# Patient Record
Sex: Male | Born: 1970 | Race: White | Hispanic: No | Marital: Single | Smoking: Never smoker
Health system: Southern US, Community
[De-identification: ages and names within clinical notes are randomized; demographics above are authoritative.]

## PROBLEM LIST (undated history)

## (undated) DIAGNOSIS — N200 Calculus of kidney: Secondary | ICD-10-CM

## (undated) HISTORY — PX: SHOULDER ARTHROSCOPY WITH SUBACROMIAL DECOMPRESSION: SHX5684

## (undated) HISTORY — PX: LITHOTRIPSY: SUR834

## (undated) HISTORY — PX: APPENDECTOMY: SHX54

## (undated) HISTORY — PX: ANKLE RECONSTRUCTION: SHX1151

---

## 2014-06-12 HISTORY — PX: URETERAL STENT PLACEMENT: SHX822

## 2015-02-26 ENCOUNTER — Emergency Department (HOSPITAL_COMMUNITY): Payer: BLUE CROSS/BLUE SHIELD

## 2015-02-26 ENCOUNTER — Encounter (HOSPITAL_COMMUNITY): Payer: Self-pay

## 2015-02-26 ENCOUNTER — Emergency Department (HOSPITAL_COMMUNITY)
Admission: EM | Admit: 2015-02-26 | Discharge: 2015-02-27 | Disposition: A | Discharge status: Home / Self Care | Payer: BLUE CROSS/BLUE SHIELD | Attending: Emergency Medicine | Admitting: Emergency Medicine

## 2015-02-26 DIAGNOSIS — R109 Unspecified abdominal pain: Secondary | ICD-10-CM | POA: Diagnosis present

## 2015-02-26 DIAGNOSIS — N200 Calculus of kidney: Secondary | ICD-10-CM

## 2015-02-26 HISTORY — DX: Calculus of kidney: N20.0

## 2015-02-26 LAB — CBC WITH DIFFERENTIAL/PLATELET
Basophils Absolute: 0 10*3/uL (ref 0.0–0.1)
Basophils Relative: 0 %
EOS PCT: 3 %
Eosinophils Absolute: 0.2 10*3/uL (ref 0.0–0.7)
HEMATOCRIT: 44.4 % (ref 39.0–52.0)
Hemoglobin: 16 g/dL (ref 13.0–17.0)
LYMPHS ABS: 1.7 10*3/uL (ref 0.7–4.0)
LYMPHS PCT: 23 %
MCH: 33.4 pg (ref 26.0–34.0)
MCHC: 36 g/dL (ref 30.0–36.0)
MCV: 92.7 fL (ref 78.0–100.0)
MONO ABS: 1.1 10*3/uL — AB (ref 0.1–1.0)
Monocytes Relative: 15 %
NEUTROS ABS: 4.3 10*3/uL (ref 1.7–7.7)
Neutrophils Relative %: 59 %
PLATELETS: 318 10*3/uL (ref 150–400)
RBC: 4.79 MIL/uL (ref 4.22–5.81)
RDW: 11.7 % (ref 11.5–15.5)
WBC: 7.2 10*3/uL (ref 4.0–10.5)

## 2015-02-26 LAB — URINALYSIS, ROUTINE W REFLEX MICROSCOPIC
Bilirubin Urine: NEGATIVE
GLUCOSE, UA: NEGATIVE mg/dL
KETONES UR: NEGATIVE mg/dL
Nitrite: NEGATIVE
PROTEIN: 100 mg/dL — AB
Specific Gravity, Urine: 1.02 (ref 1.005–1.030)
UROBILINOGEN UA: 0.2 mg/dL (ref 0.0–1.0)
pH: 5.5 (ref 5.0–8.0)

## 2015-02-26 LAB — COMPREHENSIVE METABOLIC PANEL
ALT: 26 U/L (ref 17–63)
AST: 32 U/L (ref 15–41)
Albumin: 3.9 g/dL (ref 3.5–5.0)
Alkaline Phosphatase: 79 U/L (ref 38–126)
Anion gap: 10 (ref 5–15)
BILIRUBIN TOTAL: 0.7 mg/dL (ref 0.3–1.2)
BUN: 20 mg/dL (ref 6–20)
CHLORIDE: 103 mmol/L (ref 101–111)
CO2: 24 mmol/L (ref 22–32)
CREATININE: 1.34 mg/dL — AB (ref 0.61–1.24)
Calcium: 9 mg/dL (ref 8.9–10.3)
Glucose, Bld: 97 mg/dL (ref 65–99)
POTASSIUM: 3.8 mmol/L (ref 3.5–5.1)
Sodium: 137 mmol/L (ref 135–145)
TOTAL PROTEIN: 7.1 g/dL (ref 6.5–8.1)

## 2015-02-26 LAB — URINE MICROSCOPIC-ADD ON

## 2015-02-26 MED ORDER — HYDROMORPHONE HCL 1 MG/ML IJ SOLN
1.0000 mg | Freq: Once | INTRAMUSCULAR | Status: AC
  Administered 2015-02-26: 1 mg via INTRAVENOUS
  Filled 2015-02-26: qty 1

## 2015-02-26 MED ORDER — OXYCODONE-ACETAMINOPHEN 5-325 MG PO TABS
1.0000 | ORAL_TABLET | Freq: Once | ORAL | Status: AC
  Administered 2015-02-26: 1 via ORAL
  Filled 2015-02-26: qty 1

## 2015-02-26 NOTE — ED Notes (Signed)
Patient transported to Ultrasound 

## 2015-02-26 NOTE — ED Notes (Signed)
The pt last voided  Well at 1600  He has just been dribbling since then

## 2015-02-26 NOTE — ED Notes (Addendum)
The pt is c/o lt  Flank pain and on Monday in [place] he had laser removal 3 kidney stones.  His pain went away after that   For 2 days the returned with bloody urine and not able to void .Marland Kitchen

## 2015-02-26 NOTE — ED Notes (Signed)
Last percocet was at 1630  He took 2 tabs  And he had toradol around 1330

## 2015-02-26 NOTE — ED Provider Notes (Signed)
CSN: [id number]     Arrival date & time 02/26/15  2023 History   First MD Initiated Contact with Patient 02/26/15 2146     Chief Complaint  Patient presents with  . Flank Pain     (Consider location/radiation/quality/duration/timing/severity/associated sxs/prior Treatment) HPI   Caleb Lane is a 44 y.o. male with PMH significant for kidney stones who presents with left flank pain.  Pt recently moved from [place] and is establishing care in the [place] area.  Pt states on Monday he had a lithotripsy along with stent placement to remove 3 stones.  Tuesday AM he went to the ER in [place] for left flank pain and he was unable to void.  He was discharged with pain management and stool softner once he was able to void.  He began doing better Wednesday and Thursday.  However, this morning he began experiencing sharp intermittent left flank pain with hematuria.  He reports he has been unable to void since 3 pm.  He has been taking his Flomax, Norco, and Toradol.  He has an appointment with [facility] for September 20th.  He is still passing stone particles and clots.  Denies fever, CP, SOB, abdominal pain, or back pain.   Past Medical History  Diagnosis Date  . Kidney calculi    Past Surgical History  Procedure Laterality Date  . Ureteral stent placement  2016    02/21/2015  . Shoulder arthroscopy with subacromial decompression    . Appendectomy    . Ankle reconstruction Right   . Lithotripsy      has had 12    No family history on file. Social History  Substance Use Topics  . Smoking status: Never Smoker   . Smokeless tobacco: None  . Alcohol Use: Yes     Comment: 2 a month    Review of Systems All other systems negative unless otherwise stated in HPI    Allergies  Review of patient's allergies indicates no known allergies.  Home Medications   Prior to Admission medications   Not on File   BP 143/90 mmHg  Pulse 84  Temp(Src) 97.8 F (36.6 C)  Resp 18  Ht 5'  10" (1.778 m)  Wt 231 lb 3 oz (104.866 kg)  BMI 33.17 kg/m2  SpO2 97% Physical Exam  Constitutional: He is oriented to person, place, and time. He appears well-developed and well-nourished.  HENT:  Head: Normocephalic and atraumatic.  Mouth/Throat: Oropharynx is clear and moist.  Cardiovascular: Normal rate and regular rhythm.   No murmur heard. Pulmonary/Chest: Effort normal and breath sounds normal. No respiratory distress. He has no wheezes. He has no rales.  Abdominal: Soft. Bowel sounds are normal. He exhibits no distension. There is no tenderness. There is no rigidity, no guarding and no CVA tenderness.  Musculoskeletal: Normal range of motion.  Neurological: He is alert and oriented to person, place, and time.  Skin: Skin is warm and dry.  Psychiatric: He has a normal mood and affect. His behavior is normal.    ED Course  Procedures (including critical care time) Labs Review Labs Reviewed  CBC WITH DIFFERENTIAL/PLATELET - Abnormal; Notable for the following:    Monocytes Absolute 1.1 (*)    All other components within normal limits  COMPREHENSIVE METABOLIC PANEL - Abnormal; Notable for the following:    Creatinine, Ser 1.34 (*)    All other components within normal limits  URINALYSIS, ROUTINE W REFLEX MICROSCOPIC (NOT AT [facility]) - Abnormal; Notable for the following:  APPearance CLOUDY (*)    Hgb urine dipstick LARGE (*)    Protein, ur 100 (*)    Leukocytes, UA MODERATE (*)    All other components within normal limits  URINE MICROSCOPIC-ADD ON - Abnormal; Notable for the following:    Bacteria, UA FEW (*)    All other components within normal limits    Imaging Review Dg Abd 1 View  02/26/2015   CLINICAL DATA:  Left flank pain. History of renal calculi and prior lithotripsy. Stent placement.  EXAM: ABDOMEN - 1 VIEW  COMPARISON:  None.  FINDINGS: Left ureteral stent in place. No calculi or stone fragments adjacent to or along the course of the ureteral stent. Bilateral  renal calculi are seen. Multiple calcifications in the pelvis, many of which represent phleboliths, though bladder stone is not excluded. Normal bowel gas pattern with moderate stool in the right colon. No acute osseous abnormalities.  IMPRESSION: Left ureteral stent in place. No calcifications along the course of the stent. Pelvic calcifications, likely phleboliths, however bladder stone not excluded. Bilateral nephrolithiasis.   Electronically Signed   By: Rubye Oaks M.D.   On: 02/26/2015 23:59   US Renal  02/27/2015   CLINICAL DATA:  44 year old male with left flank pain.  EXAM: RENAL / URINARY TRACT ULTRASOUND COMPLETE  COMPARISON:  Abdominal radiograph dated 02/26/2015  FINDINGS: Right Kidney:  Length: 13 cm. Echogenicity within normal limits. No mass or hydronephrosis visualized.  Left Kidney:  Length: 11 cm. A left ureteral stent is partially visualized with proximal tip in the left renal pelvis and distal end within the bladder. There is mild left hydronephrosis.  Bladder:  Appears normal for degree of bladder distention.  IMPRESSION: Unremarkable right kidney.  Mild left hydronephrosis. A left ureteral stent is partially visualized.   Electronically Signed   By: Elgie Collard M.D.   On: 02/27/2015 00:08   I have personally reviewed and evaluated these images and lab results as part of my medical decision-making.   EKG Interpretation None      MDM   Final diagnoses:  Flank pain    Patient presents with left flank pain.  VSS, patient appears nontoxic, NAD.  On exam, no abdominal tenderness.  Suspect nephrolithiasis.    Imaging ordered include KUB and abdominal ultrasound to confirm stent placement.  Labs include UA, CBC, CMP.  UA shows large hemoglobin.  WBC 7.2.  Spoke with Dr. Neva Seat with [facility] in the ED provider room.  Per his recommendation, KUB, abdominal ultrasound, pain management, and [facility] follow up Monday.  Left ureteral stent in place.  No calculi  or stone fragments adjacent to or along course of ureteral stent.  Mild hydronephrosis.   Pt given Dilaudid in ED.  Suspect nephrolithiasis. Per urology recommendations, pt stable for d/c.  Advised to follow up in 2 days [facility]) with urology.  Discussed return precautions and supportive care.  Patient acknowledges and agrees with the above plan.  Case has been discussed with by Dr. Broadus John who agrees with the above plan for discharge.       Cheri Fowler, PA-C 02/27/15 0025  Arby Barrette, MD 02/27/15 2158

## 2015-02-26 NOTE — ED Notes (Signed)
PA at bedside.

## 2015-02-27 ENCOUNTER — Other Ambulatory Visit (HOSPITAL_COMMUNITY): Payer: BLUE CROSS/BLUE SHIELD

## 2015-02-27 MED ORDER — HYDROMORPHONE HCL 1 MG/ML IJ SOLN
1.0000 mg | Freq: Once | INTRAMUSCULAR | Status: AC
  Administered 2015-02-27: 1 mg via INTRAVENOUS
  Filled 2015-02-27: qty 1

## 2015-02-27 MED ORDER — OXYCODONE-ACETAMINOPHEN 5-325 MG PO TABS: 1.0000 | ORAL_TABLET | ORAL | Status: AC | PRN

## 2015-02-27 NOTE — Discharge Instructions (Signed)

## 2016-01-13 IMAGING — CR DG ABDOMEN 1V
2 series · 2 of 2 positions shown · non-contrast
Comparison: None.

CLINICAL DATA: Left flank pain. History of renal calculi and prior
lithotripsy. Stent placement.

EXAM:
ABDOMEN - 1 VIEW

[abdomen kub (1 of 2)]
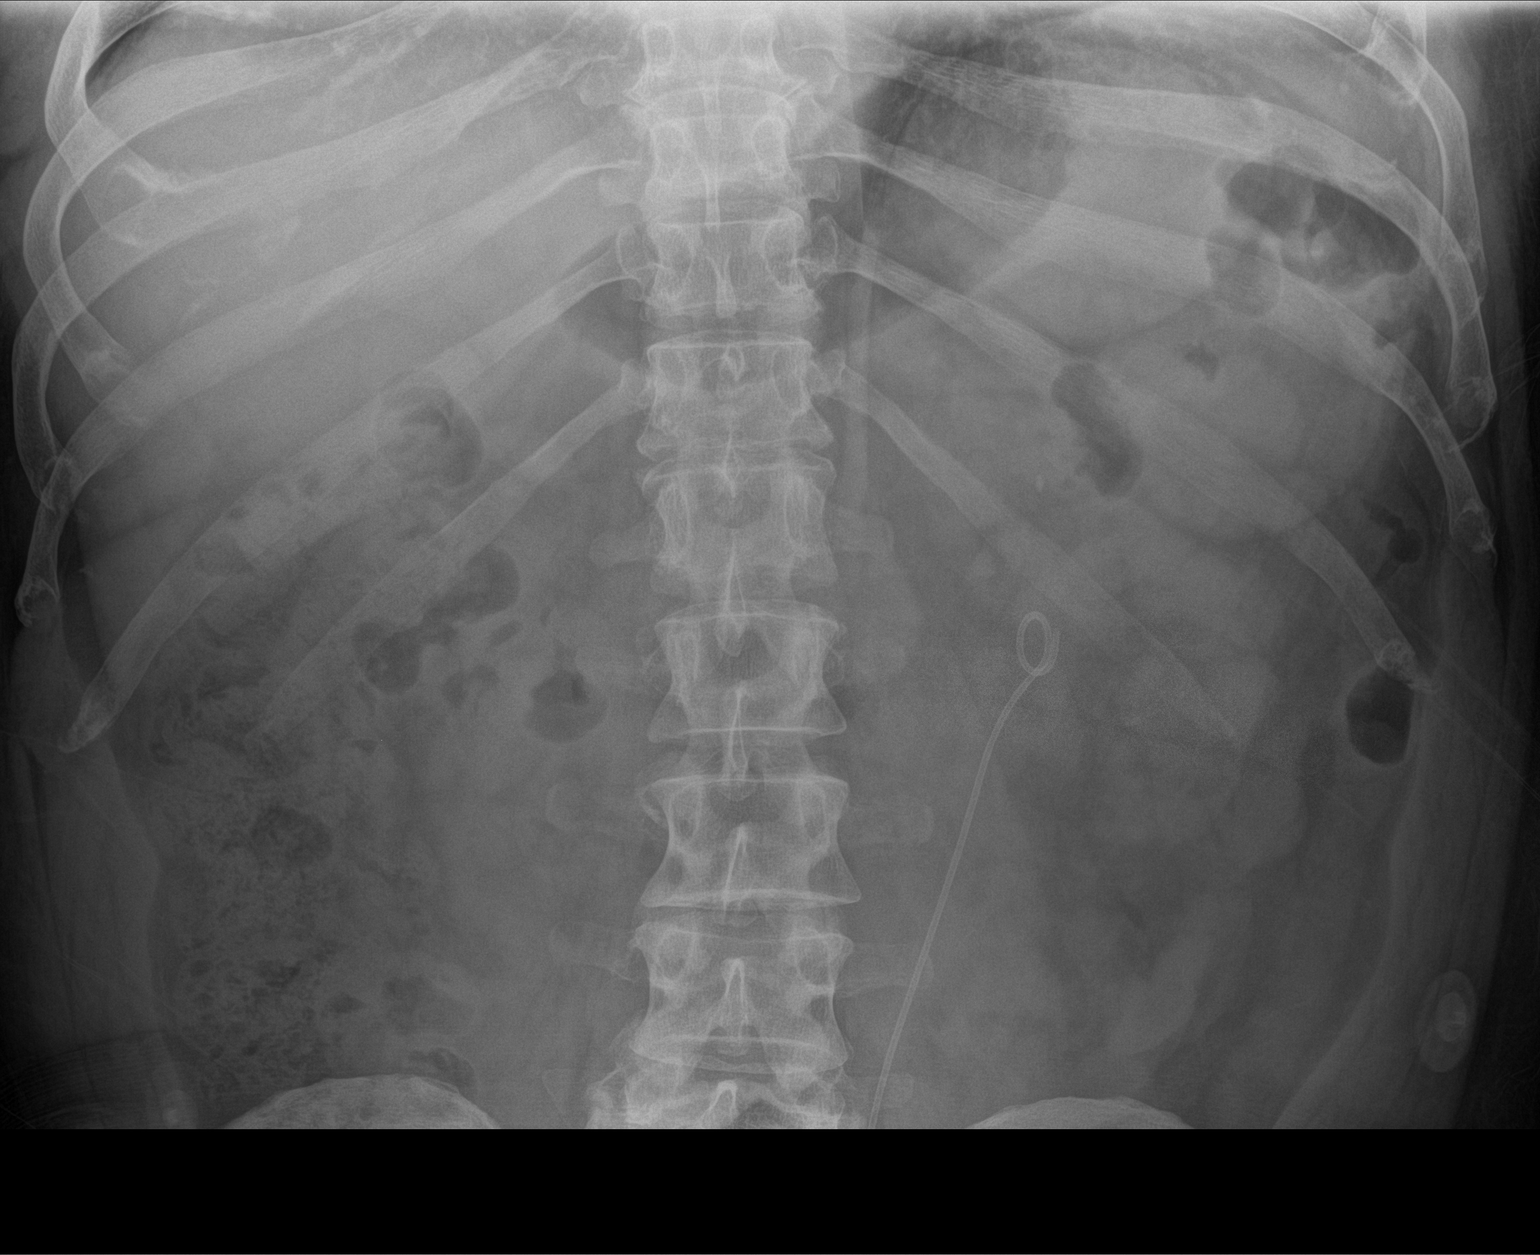

[abdomen kub (2 of 2)]
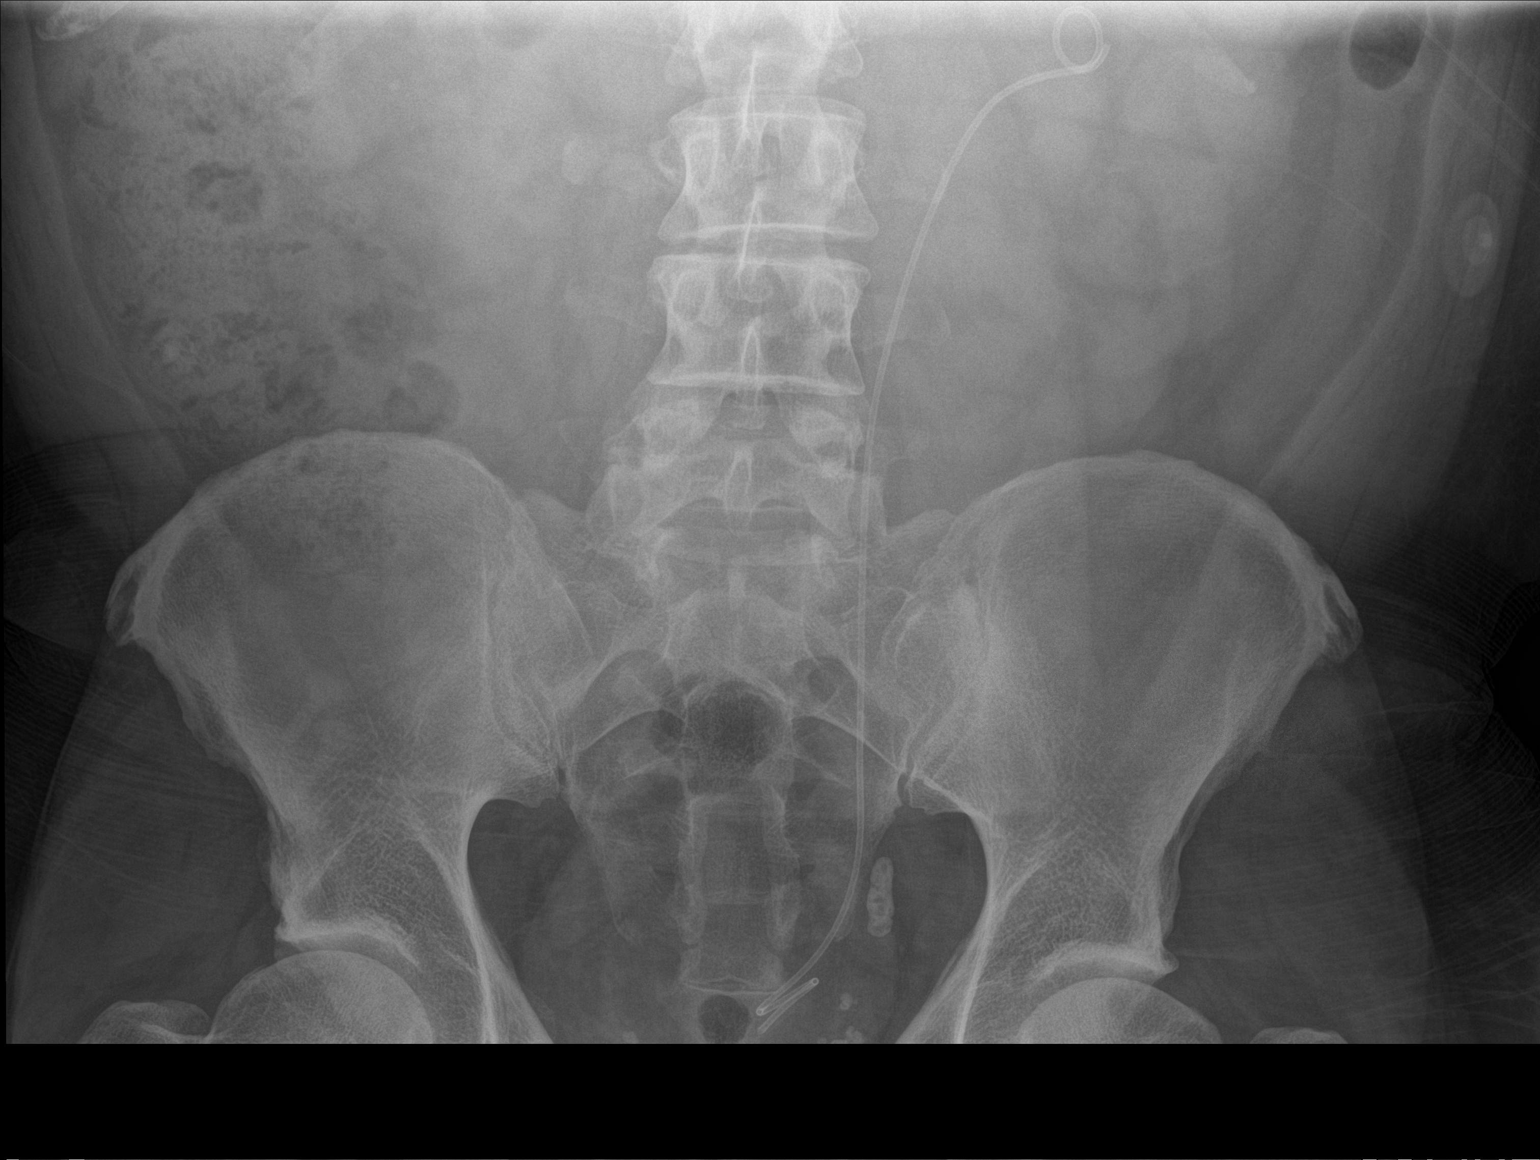

[2 of 2 positions shown; findings below may reference images not displayed]

FINDINGS: Left ureteral stent in place. No calculi or stone fragments adjacent
to or along the course of the ureteral stent. Bilateral renal
calculi are seen. Multiple calcifications in the pelvis, many of
which represent phleboliths, though bladder stone is not excluded.
Normal bowel gas pattern with moderate stool in the right colon. No
acute osseous abnormalities.
IMPRESSION: Left ureteral stent in place. No calcifications along the course of
the stent. Pelvic calcifications, likely phleboliths, however
bladder stone not excluded. Bilateral nephrolithiasis.

## 2022-01-31 ENCOUNTER — Ambulatory Visit (INDEPENDENT_AMBULATORY_CARE_PROVIDER_SITE_OTHER): Payer: BC Managed Care – PPO | Admitting: Family

## 2022-01-31 ENCOUNTER — Encounter (INDEPENDENT_AMBULATORY_CARE_PROVIDER_SITE_OTHER): Payer: Self-pay | Admitting: Family

## 2022-01-31 VITALS — BP 124/82 | HR 83 | Temp 97.9°F | Resp 16 | Ht 70.0 in | Wt 280.0 lb

## 2022-01-31 DIAGNOSIS — J0110 Acute frontal sinusitis, unspecified: Secondary | ICD-10-CM

## 2022-01-31 MED ORDER — DOXYCYCLINE HYCLATE 100 MG PO TABS
100.0000 mg | ORAL_TABLET | Freq: Two times a day (BID) | ORAL | 0 refills | Status: AC
Start: 2022-01-31 — End: 2022-02-07

## 2022-01-31 MED ORDER — BENZONATATE 100 MG PO CAPS
100.0000 mg | ORAL_CAPSULE | Freq: Three times a day (TID) | ORAL | 0 refills | Status: AC | PRN
Start: 2022-01-31 — End: 2022-02-05

## 2022-01-31 MED ORDER — FLUTICASONE PROPIONATE 50 MCG/ACT NA SUSP
2.0000 | Freq: Every day | NASAL | 0 refills | Status: AC
Start: 2022-01-31 — End: 2022-02-07

## 2022-01-31 NOTE — Patient Instructions (Signed)
Over-the-counter acetaminophen or ibuprofen as needed for pain per instructions on the bottle.  Please follow-up with your primary care provider if your symptoms do not improve in the next 2 to 3 days.  Please seek emergency care if the symptoms are worsening.

## 2022-01-31 NOTE — Progress Notes (Signed)
[facility]  URGENT  CARE  PROGRESS NOTE     Patient: Richard Wright   Date: 01/31/2022   MRN: [id number]       Richard Wright is a 51 y.o. male      HISTORY     History obtained from: Patient    Chief Complaint   Patient presents with    Sinus Problem     Regularly tested for covid, zpack 6 days ago but cough got stronger, taking mucinex & dayquil, post nasal drip,          Sinus Problem     51 year old male presents to the urgent care with 1 week of sinus pain and pressure, dry cough, nasal congestion with greenish nasal discharge.  States that he was treated with Z-Pak which did not improve the symptoms.  Patient believes that he has a sinus infection.  Has tested himself multiple times at home, all negative results.  Denies fever/chills, shortness of breath, chest pain, nausea/vomiting, abdominal pain.    Review of Systems    History:    Pertinent Past Medical, Surgical, Family and Social History were reviewed.        Current Outpatient Medications:     valsartan (DIOVAN) 80 MG tablet, , Disp: , Rfl:     benzonatate (TESSALON) 100 MG capsule, Take 1 capsule (100 mg) by mouth 3 (three) times daily as needed for Cough, Disp: 15 capsule, Rfl: 0    doxycycline (VIBRA-TABS) 100 MG tablet, Take 1 tablet (100 mg) by mouth every 12 (twelve) hours for 7 days, Disp: 14 tablet, Rfl: 0    fluticasone (FLONASE) 50 MCG/ACT nasal spray, 2 sprays by Nasal route daily for 7 days, Disp: 16 g, Rfl: 0    No Known Allergies    Medications and Allergies reviewed.    PHYSICAL EXAM     Vitals:    01/31/22 1602   BP: 124/82   Pulse: 83   Resp: 16   Temp: 97.9 F (36.6 C)   SpO2: 98%   Weight: 127 kg (280 lb)   Height: 1.778 m ('5\' 10"'$ )       Physical Exam  Constitutional:       Appearance: Normal appearance.   HENT:      Head: Normocephalic and atraumatic.      Right Ear: Tympanic membrane, ear canal and external ear normal.      Left Ear: Tympanic membrane, ear canal and external ear normal.      Nose: Congestion present.      Comments: Frontal  sinus tenderness.     Mouth/Throat:      Mouth: Mucous membranes are moist.      Pharynx: No oropharyngeal exudate or posterior oropharyngeal erythema.     Eyes: Conjunctivae are normal. Cardiovascular:      Rate and Rhythm: Normal rate and regular rhythm.      Pulses: Normal pulses.      Heart sounds: Normal heart sounds.   Pulmonary:      Effort: Pulmonary effort is normal. No respiratory distress.      Breath sounds: Normal breath sounds. No stridor. No wheezing, rhonchi or rales.   Musculoskeletal:         General: Normal range of motion.      Cervical back: Normal range of motion and neck supple.   Lymphadenopathy:      Cervical: No cervical adenopathy.   Neurological:      Mental Status: He is alert and oriented to person,  place, and time.   Skin:     General: Skin is warm and dry.      Capillary Refill: Capillary refill takes less than 2 seconds.      Findings: No rash.   Psychiatric:         Behavior: Behavior normal.         UCC COURSE     There were no labs reviewed with this patient during the visit.    There were no x-rays reviewed with this patient during the visit.    No current facility-administered medications for this visit.       PROCEDURES     Procedures    MEDICAL DECISION MAKING         Chart Review:  Prior PCP, Specialist and/or ED notes reviewed today: No  Prior labs/images/studies reviewed today: No          ASSESSMENT     Encounter Diagnosis   Name Primary?    Acute frontal sinusitis, recurrence not specified Yes                PLAN      PLAN: Shared decision making with patient to treat for bacterial sinusitis.  Prescribed doxycycline, Flonase, Tessalon Perle.  Discussed red flags, ER precautions, over-the-counter symptomatic relief, follow-up with PCP if no improvement.  All questions answered.  Patient states understanding.            No orders of the defined types were placed in this encounter.    Requested Prescriptions     Signed Prescriptions Disp Refills    doxycycline (VIBRA-TABS)  100 MG tablet 14 tablet 0     Sig: Take 1 tablet (100 mg) by mouth every 12 (twelve) hours for 7 days    fluticasone (FLONASE) 50 MCG/ACT nasal spray 16 g 0     Sig: 2 sprays by Nasal route daily for 7 days    benzonatate (TESSALON) 100 MG capsule 15 capsule 0     Sig: Take 1 capsule (100 mg) by mouth 3 (three) times daily as needed for Cough       Discussed results and diagnosis with patient/family.  Reviewed warning signs for worsening condition, as well as, indications for follow-up with primary care physician and return to urgent care clinic.   Patient/family expressed understanding of instructions.     An After Visit Summary was provided to the patient.
